# Patient Record
Sex: Male | Born: 1990 | Race: Black or African American | Hispanic: No | Marital: Married | Smoking: Never smoker
Health system: Southern US, Community
[De-identification: ages and names within clinical notes are randomized; demographics above are authoritative.]

## PROBLEM LIST (undated history)

## (undated) DIAGNOSIS — I10 Essential (primary) hypertension: Secondary | ICD-10-CM

---

## 2015-04-09 ENCOUNTER — Emergency Department (HOSPITAL_COMMUNITY)

## 2015-04-09 ENCOUNTER — Emergency Department (HOSPITAL_COMMUNITY)
Admission: EM | Admit: 2015-04-09 | Discharge: 2015-04-09 | Disposition: A | Discharge status: Home / Self Care | Attending: Emergency Medicine | Admitting: Emergency Medicine

## 2015-04-09 ENCOUNTER — Encounter (HOSPITAL_COMMUNITY): Payer: Self-pay | Admitting: Emergency Medicine

## 2015-04-09 DIAGNOSIS — S61209A Unspecified open wound of unspecified finger without damage to nail, initial encounter: Secondary | ICD-10-CM

## 2015-04-09 DIAGNOSIS — W208XXA Other cause of strike by thrown, projected or falling object, initial encounter: Secondary | ICD-10-CM | POA: Insufficient documentation

## 2015-04-09 DIAGNOSIS — Y998 Other external cause status: Secondary | ICD-10-CM | POA: Diagnosis not present

## 2015-04-09 DIAGNOSIS — I10 Essential (primary) hypertension: Secondary | ICD-10-CM | POA: Insufficient documentation

## 2015-04-09 DIAGNOSIS — Y9389 Activity, other specified: Secondary | ICD-10-CM | POA: Diagnosis not present

## 2015-04-09 DIAGNOSIS — Y92143 Cell of prison as the place of occurrence of the external cause: Secondary | ICD-10-CM | POA: Insufficient documentation

## 2015-04-09 DIAGNOSIS — S61215A Laceration without foreign body of left ring finger without damage to nail, initial encounter: Secondary | ICD-10-CM | POA: Insufficient documentation

## 2015-04-09 HISTORY — DX: Essential (primary) hypertension: I10

## 2015-04-09 NOTE — ED Notes (Signed)
Patient states "I was pushing a cart and the little slider fell out and fell on my left middle finger today about 1200." Patient states he has laceration to left middle finger. Bleeding controlled at this time.

## 2015-04-09 NOTE — Discharge Instructions (Signed)
Deep Skin Avulsion A deep skin avulsion is a type of open wound. It often results from a severe injury (trauma) that tears away all layers of the skin or an entire body part. The areas of the body that are most often affected by a deep skin avulsion include the face, lips, ears, nose, and fingers. A deep skin avulsion may make structures below the skin become visible. You may be able to see muscle, bone, nerves, and blood vessels. A deep skin avulsion can also damage important structures beneath the skin. These include tendons, ligaments, nerves, or blood vessels. CAUSES Injuries that often cause a deep skin avulsion include:  Being crushed.  Falling against a jagged surface.  Animal bites.  Gunshot wounds.  Severe burns.  Injuries that involve being dragged, such as bicycle or motorcycle accidents. SYMPTOMS Symptoms of a deep skin avulsion include:  Pain.  Numbness.  Swelling.  A misshapen body part.  Bleeding, which may be heavy.  Fluid leaking from the wound. DIAGNOSIS This condition may be diagnosed with a medical history and physical exam. You may also have X-rays done. TREATMENT The treatment that is chosen for a deep skin avulsion depends on how large and deep the wound is and where it is located. Treatment for all types of avulsions usually starts with:  Controlling the bleeding.  Washing out the wound with a soap and water.  Removing dead tissue from the wound. A wound may be closed or left open to heal. This depends on the size and location of the wound and whether it is likely to become infected. Wounds are usually covered or closed if they expose blood vessels, nerves, bone, or cartilage.  Wounds that are small and clean may be closed with stitches (sutures).  Wounds that cannot be closed with sutures may be covered with a piece of skin (graft) or a skin flap. Skin may be taken from on or near the wound, from another part of the body, or from a  donor.  Wounds may be left open if they are hard to close or they may become infected. These wounds heal over time from the bottom up. You may also receive medicine. This may include:  Antibiotics.  A tetanus shot.  Rabies vaccine. HOME CARE INSTRUCTIONS Medicines  Take or apply over-the-counter and prescription medicines only as told by your health care provider.  If you were prescribed an antibiotic, take or apply it as told by your health care provider. Do not stop taking the antibiotic even if your condition improves.  You may get anti-itch medicine while your wound is healing. Use it only as told by your health care provider. Wound Care  There are many ways to close and cover a wound. For example, a wound can be covered with sutures, skin glue, or adhesive strips. Follow instructions from your health care provider about:  How to take care of your wound.  When and how you should change your bandage (dressing).  When you should remove your dressing.  Removing whatever was used to close your wound.  Keep the dressing dry as told by your health care provider. Do not take baths, swim, use a hot tub, or do anything that would put your wound underwater until your health care provider approves.  Clean the wound each day or as told by your health care provider.  Wash the wound with mild soap and water.  Rinse the wound with water to remove all soap.  Pat the wound dry  with a clean towel. Do not rub it.  Do not scratch or pick at the wound.  Check your wound every day for signs of infection. Watch for:  Redness, swelling, or pain.  Fluid, blood, or pus. General Instructions  Raise (elevate) the injured area above the level of your heart while you are sitting or lying down.  Keep all follow-up visits as told by your health care provider. This is important. SEEK MEDICAL CARE IF:  You received a tetanus shot and you have swelling, severe pain, redness, or bleeding at the  injection site.  You have a fever.  Your pain is not controlled with medicine.  You have increased redness, swelling, or pain at the site of your wound.  You have fluid, blood, or pus coming from your wound.  You notice a bad smell coming from your wound or your dressing.  A wound that was closed breaks open.  You notice something coming out of the wound, such as wood or glass.  You notice a change in the color of your skin near your wound.  You develop a new rash.  You need to change the dressing frequently due to fluid, blood, or pus draining from the wound. SEEK IMMEDIATE MEDICAL CARE IF:  Your pain suddenly increases and is severe.  You develop severe swelling around the wound.  You develop numbness around the wound.  You have nausea and vomiting that does not go away after 24 hours.  You feel light-headed, weak, or faint.  You develop chest pain.  You have trouble breathing.  Your wound is on your hand or foot and you cannot properly move a finger or toe.  The wound is on your hand or foot and you notice that your fingers or toes look pale or bluish.  You have a red streak going away from your wound.   This information is not intended to replace advice given to you by your health care provider. Make sure you discuss any questions you have with your health care provider.   Document Released: 03/28/2006 Document Revised: 06/16/2014 Document Reviewed: 02/04/2014 Elsevier Interactive Patient Education 2016 [employer].   Your wound should heal without any complications.  Apply a new dressing twice daily after soap and water wash.

## 2015-04-10 NOTE — ED Provider Notes (Signed)
CSN: [id number]     Arrival date & time 04/09/15  1845 History   First MD Initiated Contact with Patient 04/09/15 2042     Chief Complaint  Patient presents with  . Laceration     (Consider location/radiation/quality/duration/timing/severity/associated sxs/prior Treatment) The history is provided by the patient.   Donald Castillo is a 25 y.o. male, right handed presenting with laceration to left distal long finger occuring around noon today.  He reports [place] when a "slider" fell out hit his hand causing laceration. He was seen by the house RN and his wound was cleaned and dressed. He presents here due to persistent pain and concern for possible finger fracture.  He denies numbness distal to the injury site and has no radiation of pain.  His tetanus is utd.     Past Medical History  Diagnosis Date  . Hypertension    History reviewed. No pertinent past surgical history. History reviewed. No pertinent family history. Social History  Substance Use Topics  . Smoking status: Never Smoker   . Smokeless tobacco: None  . Alcohol Use: No    Review of Systems  Constitutional: Negative for fever and chills.  Respiratory: Negative for shortness of breath and wheezing.   Musculoskeletal: Positive for arthralgias.  Skin: Positive for wound.  Neurological: Negative for numbness.      Allergies  Review of patient's allergies indicates no known allergies.  Home Medications   Prior to Admission medications   Not on File   BP 159/97 mmHg  Pulse 67  Temp(Src) 98.2 F (36.8 C) (Oral)  Resp 16  Ht  (1.803 m)  Wt 118.842 kg  BMI 36.56 kg/m2  SpO2 100% Physical Exam  Constitutional: He is oriented to person, place, and time. He appears well-developed and well-nourished.  HENT:  Head: Normocephalic.  Cardiovascular: Normal rate.   Pulmonary/Chest: Effort normal.  Musculoskeletal: He exhibits tenderness.       Hands: ttp left distal long  finger, no deformity. superfical avulsion wound to radial side cuticle edge.  Distal sensation intact with less than 2 sec distal cap refill.  Neurological: He is alert and oriented to person, place, and time. No sensory deficit.  Skin: Laceration noted.    ED Course  Procedures (including critical care time) Labs Review Labs Reviewed - No data to display  Imaging Review Dg Finger Middle Left  04/09/2015  CLINICAL DATA:  25 year old who sustained a laceration to the left long finger while at a store earlier today. Initial encounter. EXAM: LEFT MIDDLE FINGER 2+V COMPARISON:  None. FINDINGS: Soft tissue injury distally. No evidence of acute fracture or dislocation. Joint spaces well preserved. Well-preserved bone mineral density. No intrinsic osseous abnormalities. No opaque foreign body. IMPRESSION: No osseous abnormality.  No opaque foreign body. Electronically Signed   By: Hulan Saas M.D.   On: 04/09/2015 21:32   I have personally reviewed and evaluated these images and lab results as part of my medical decision-making.   EKG Interpretation None      MDM   Final diagnoses:  Avulsion of skin of finger, initial encounter     Radiological studies were viewed, interpreted and considered during the medical decision making and disposition process. I agree with radiologists reading.  Results were also discussed with patient.  No fracture.  Pt's wound was dressed using xeroform as base, remainder of package given with instructions to soap and water wash bid then apply new layer of xeroform and  wrapping until scab forms at site.  Prn f/u anticipated.      Burgess Amor, PA-C 04/10/15 1332  Samuel Jester, DO 04/13/15 1228

## 2017-10-08 IMAGING — DX DG FINGER MIDDLE 2+V*L*
3 series · 3 of 3 positions shown · non-contrast
Comparison: None.

CLINICAL DATA: 24-year-old who sustained a laceration to the left
long finger while at a store earlier today. Initial encounter.

EXAM:
LEFT MIDDLE FINGER 2+V

[finger ap]
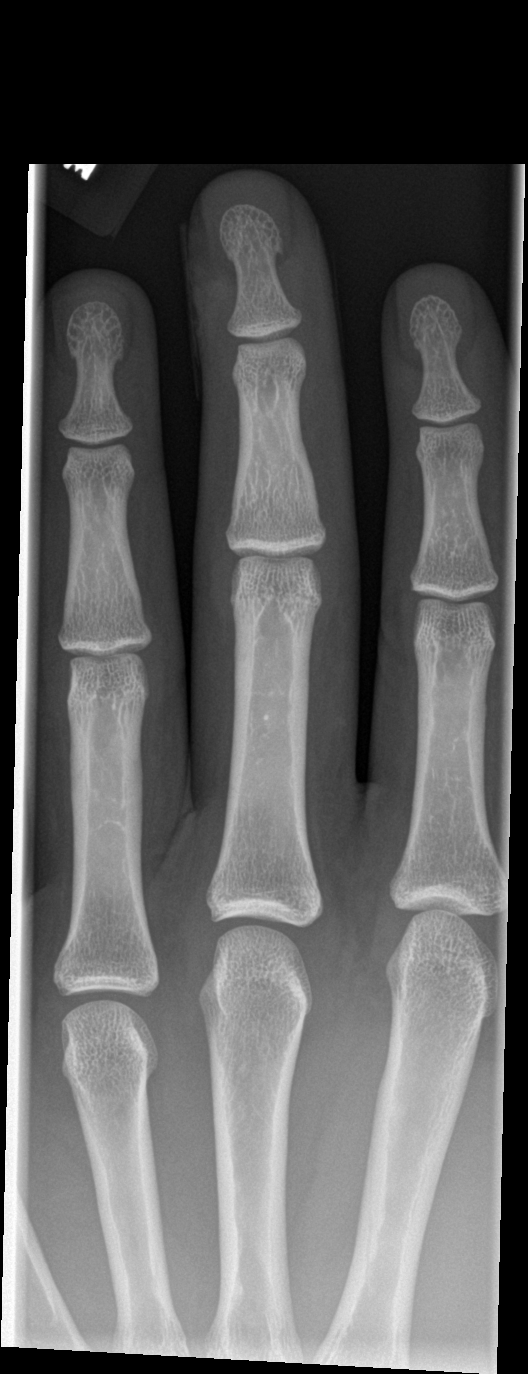

[finger obl]
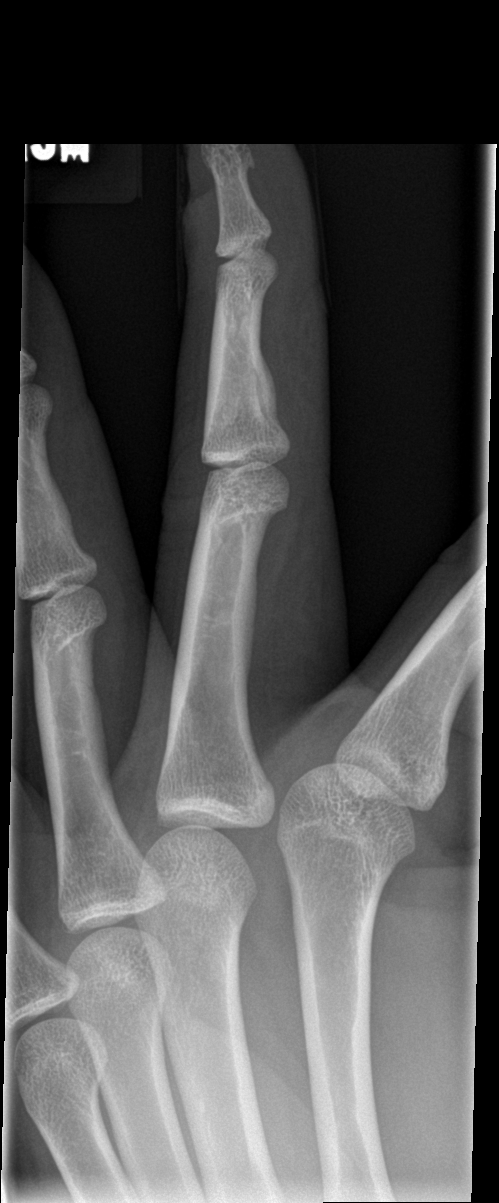

[finger lat]
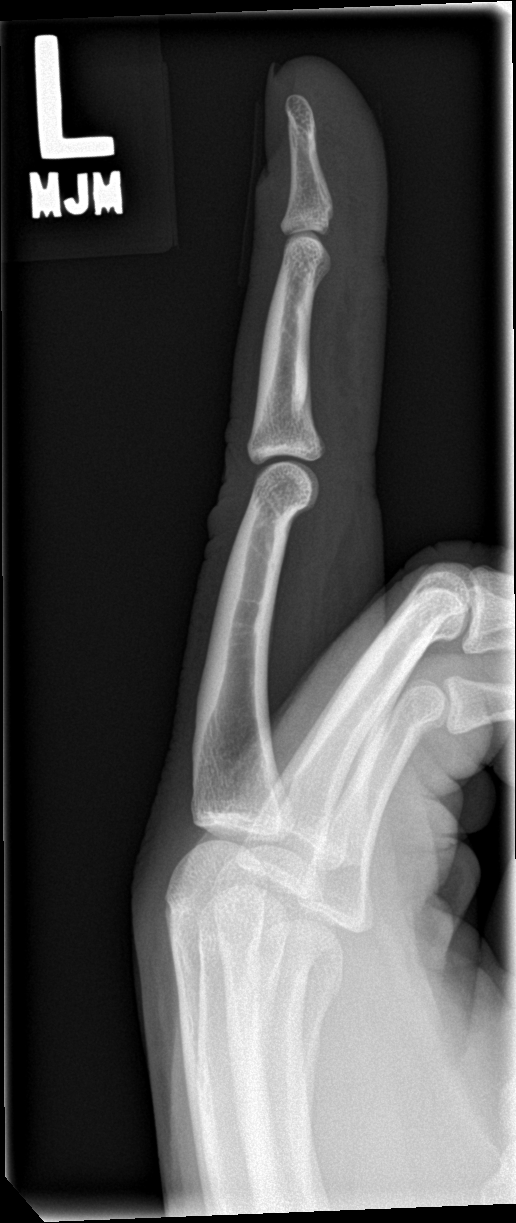

[3 of 3 positions shown; findings below may reference images not displayed]

FINDINGS: Soft tissue injury distally. No evidence of acute fracture or
dislocation. Joint spaces well preserved. Well-preserved bone
mineral density. No intrinsic osseous abnormalities. No opaque
foreign body.
IMPRESSION: No osseous abnormality.  No opaque foreign body.
